# Patient Record
Sex: Female | Born: 2000
Health system: Southern US, Community
[De-identification: ages and names within clinical notes are randomized; demographics above are authoritative.]

## PROBLEM LIST (undated history)

## (undated) DIAGNOSIS — I2510 Atherosclerotic heart disease of native coronary artery without angina pectoris: Secondary | ICD-10-CM

## (undated) DIAGNOSIS — F4310 Post-traumatic stress disorder, unspecified: Secondary | ICD-10-CM

## (undated) DIAGNOSIS — Z950 Presence of cardiac pacemaker: Secondary | ICD-10-CM

## (undated) DIAGNOSIS — F32A Depression, unspecified: Secondary | ICD-10-CM

## (undated) DIAGNOSIS — F329 Major depressive disorder, single episode, unspecified: Secondary | ICD-10-CM

## (undated) DIAGNOSIS — Q203 Discordant ventriculoarterial connection: Secondary | ICD-10-CM

## (undated) DIAGNOSIS — Q249 Congenital malformation of heart, unspecified: Secondary | ICD-10-CM

## (undated) HISTORY — PX: OTHER SURGICAL HISTORY: SHX169

## (undated) HISTORY — PX: PACEMAKER LEAD REMOVAL: SHX5064

## (undated) HISTORY — PX: CARDIAC SURGERY: SHX584

---
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
## 2020-04-03 ENCOUNTER — Encounter (HOSPITAL_BASED_OUTPATIENT_CLINIC_OR_DEPARTMENT_OTHER): Payer: Self-pay | Admitting: Emergency Medicine

## 2020-04-03 ENCOUNTER — Other Ambulatory Visit (HOSPITAL_BASED_OUTPATIENT_CLINIC_OR_DEPARTMENT_OTHER): Payer: Self-pay | Admitting: Emergency Medicine

## 2020-04-03 ENCOUNTER — Emergency Department (HOSPITAL_BASED_OUTPATIENT_CLINIC_OR_DEPARTMENT_OTHER): Payer: Medicaid Other

## 2020-04-03 ENCOUNTER — Emergency Department (HOSPITAL_BASED_OUTPATIENT_CLINIC_OR_DEPARTMENT_OTHER)
Admission: EM | Admit: 2020-04-03 | Discharge: 2020-04-03 | Disposition: A | Discharge status: Home / Self Care | Payer: Medicaid Other | Attending: Emergency Medicine | Admitting: Emergency Medicine

## 2020-04-03 ENCOUNTER — Other Ambulatory Visit: Payer: Self-pay

## 2020-04-03 DIAGNOSIS — I2510 Atherosclerotic heart disease of native coronary artery without angina pectoris: Secondary | ICD-10-CM | POA: Insufficient documentation

## 2020-04-03 DIAGNOSIS — Z20822 Contact with and (suspected) exposure to covid-19: Secondary | ICD-10-CM | POA: Diagnosis not present

## 2020-04-03 DIAGNOSIS — R519 Headache, unspecified: Secondary | ICD-10-CM | POA: Diagnosis not present

## 2020-04-03 DIAGNOSIS — R5383 Other fatigue: Secondary | ICD-10-CM | POA: Insufficient documentation

## 2020-04-03 DIAGNOSIS — R112 Nausea with vomiting, unspecified: Secondary | ICD-10-CM

## 2020-04-03 DIAGNOSIS — Z950 Presence of cardiac pacemaker: Secondary | ICD-10-CM | POA: Insufficient documentation

## 2020-04-03 DIAGNOSIS — R42 Dizziness and giddiness: Secondary | ICD-10-CM | POA: Insufficient documentation

## 2020-04-03 HISTORY — DX: Atherosclerotic heart disease of native coronary artery without angina pectoris: I25.10

## 2020-04-03 LAB — CBC WITH DIFFERENTIAL/PLATELET
Abs Immature Granulocytes: 0.01 10*3/uL (ref 0.00–0.07)
Basophils Absolute: 0 10*3/uL (ref 0.0–0.1)
Basophils Relative: 0 %
Eosinophils Absolute: 0 10*3/uL (ref 0.0–0.5)
Eosinophils Relative: 0 %
HCT: 41.5 % (ref 36.0–46.0)
Hemoglobin: 13.8 g/dL (ref 12.0–15.0)
Immature Granulocytes: 0 %
Lymphocytes Relative: 8 %
Lymphs Abs: 0.7 10*3/uL (ref 0.7–4.0)
MCH: 29.5 pg (ref 26.0–34.0)
MCHC: 33.3 g/dL (ref 30.0–36.0)
MCV: 88.7 fL (ref 80.0–100.0)
Monocytes Absolute: 0.4 10*3/uL (ref 0.1–1.0)
Monocytes Relative: 5 %
Neutro Abs: 6.9 10*3/uL (ref 1.7–7.7)
Neutrophils Relative %: 87 %
Platelets: 186 10*3/uL (ref 150–400)
RBC: 4.68 MIL/uL (ref 3.87–5.11)
RDW: 13.4 % (ref 11.5–15.5)
WBC: 7.9 10*3/uL (ref 4.0–10.5)
nRBC: 0 % (ref 0.0–0.2)

## 2020-04-03 LAB — COMPREHENSIVE METABOLIC PANEL
ALT: 16 U/L (ref 0–44)
AST: 20 U/L (ref 15–41)
Albumin: 3.7 g/dL (ref 3.5–5.0)
Alkaline Phosphatase: 81 U/L (ref 38–126)
Anion gap: 13 (ref 5–15)
BUN: 12 mg/dL (ref 6–20)
CO2: 21 mmol/L — ABNORMAL LOW (ref 22–32)
Calcium: 8.9 mg/dL (ref 8.9–10.3)
Chloride: 105 mmol/L (ref 98–111)
Creatinine, Ser: 0.51 mg/dL (ref 0.44–1.00)
GFR, Estimated: >60 mL/min (ref >60)
Glucose, Bld: 147 mg/dL — ABNORMAL HIGH (ref 70–99)
Potassium: 3.6 mmol/L (ref 3.5–5.1)
Sodium: 139 mmol/L (ref 135–145)
Total Bilirubin: 0.4 mg/dL (ref 0.3–1.2)
Total Protein: 7.2 g/dL (ref 6.5–8.1)

## 2020-04-03 LAB — URINALYSIS, ROUTINE W REFLEX MICROSCOPIC
Bilirubin Urine: NEGATIVE
Glucose, UA: NEGATIVE mg/dL
Hgb urine dipstick: NEGATIVE
Ketones, ur: NEGATIVE mg/dL
Leukocytes,Ua: NEGATIVE
Nitrite: NEGATIVE
Protein, ur: NEGATIVE mg/dL
Specific Gravity, Urine: 1.03 (ref 1.005–1.030)
pH: 6 (ref 5.0–8.0)

## 2020-04-03 LAB — SARS CORONAVIRUS 2 BY RT PCR (HOSPITAL ORDER, PERFORMED IN ~~LOC~~ HOSPITAL LAB): SARS Coronavirus 2: NEGATIVE

## 2020-04-03 LAB — PREGNANCY, URINE: Preg Test, Ur: NEGATIVE

## 2020-04-03 LAB — LIPASE, BLOOD: Lipase: 28 U/L (ref 11–51)

## 2020-04-03 MED ORDER — ONDANSETRON HCL 4 MG/2ML IJ SOLN
4.0000 mg | Freq: Once | INTRAMUSCULAR | Status: AC
  Administered 2020-04-03: 4 mg via INTRAVENOUS
  Filled 2020-04-03: qty 2

## 2020-04-03 MED ORDER — PROMETHAZINE HCL 25 MG/ML IJ SOLN
12.5000 mg | Freq: Once | INTRAMUSCULAR | Status: AC
  Administered 2020-04-03: 12.5 mg via INTRAVENOUS
  Filled 2020-04-03: qty 1

## 2020-04-03 MED ORDER — PROMETHAZINE HCL 25 MG PO TABS: 25.0000 mg | ORAL_TABLET | Freq: Four times a day (QID) | ORAL | 0 refills | Status: AC | PRN

## 2020-04-03 MED ORDER — ACETAMINOPHEN 325 MG PO TABS
ORAL_TABLET | ORAL | Status: AC
  Administered 2020-04-03: 650 mg via ORAL
  Filled 2020-04-03: qty 2

## 2020-04-03 MED ORDER — ACETAMINOPHEN 325 MG PO TABS: 650.0000 mg | ORAL_TABLET | Freq: Once | ORAL | Status: AC

## 2020-04-03 MED FILL — PROMETHAZINE 25 MG TABLET: 25 | 7 days supply | Qty: 30 | Fill #0

## 2020-04-03 NOTE — Discharge Instructions (Signed)
You have been seen in the Emergency Department (ED) today for nausea and vomiting.  Your work up today has not shown a clear cause for your symptoms. You have been prescribed Phenergan; please use as prescribed as needed for your nausea.  Follow up with your doctor as soon as possible regarding today's emergent visit and your symptoms of nausea.   Return to the ED if you develop abdominal, bloody vomiting, bloody diarrhea, if you are unable to tolerate fluids due to vomiting, or if you develop other symptoms that concern you.  

## 2020-04-03 NOTE — ED Triage Notes (Signed)
Pt arrives via [facility] from home with c/o NV X1 day EMS reports temp of 100.5, 88% RA, pt reports thi si her baseline r/t heart issues, 70 HR, 1140/68, 16 RR. Pt does have a pacemaker.

## 2020-04-03 NOTE — ED Provider Notes (Signed)
Emergency Department Provider Note   I have reviewed the triage vital signs and the nursing notes.   HISTORY  Chief Complaint Emesis   HPI Carolyn West is a 20 y.o. female with past medical history of congenital heart defect causing baseline low O2 sat presents emergency department with nausea along with vomiting and headache.  Patient describes symptoms starting yesterday.  She is not feeling cough or shortness of breath.  She is having some headache along with nausea vomiting but no abdominal pain.  No associated diarrhea.  No blood in the emesis.  Patient arrives by EMS who reported temp on scene of 100.5 with room air sat of 88%.  Patient does describe some lightheaded feeling last night.  No sick contacts.   Past Medical History:  Diagnosis Date  . Congenital heart defect    Per [facility] cardiology records: transposition of the great vessels, VSD, subpulmonic stenosis, s/p modified Blalock-Taussig shunt, bidirectional Glenn, lateral tunnel fenestrated Fontan  . Coronary artery disease   . Depression   . Pacemaker    epicardial dual chamber PPM 2005, abdominal LUQ generator change 12/11/10, replacement of fractured atrial lead 04/24/15 via left thoracostomy approach   . PTSD (post-traumatic stress disorder)   . PTSD (post-traumatic stress disorder)   . Transposition of great arteries     Patient Active Problem List   Diagnosis Date Noted  . 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 CARDIAC SURGERY    . FLEXOR TENDON REPAIR Left 06/13/2019   Procedure: Left index finger flexor tendon repair, digital nerve repair and surgery as inidcated;  Surgeon: Ernest Mallick, MD;  Location: [facility] OR;  Service: Orthopedics;  Laterality: Left;   . PACEMAKER LEAD REMOVAL      replaced leads  .  pacermaker    . wisdom teeth extracted       Allergies Patient has no known allergies.  History reviewed. No pertinent family history.  Social History Social History   Tobacco Use  . Smoking status: Never Smoker  . Smokeless tobacco: Never Used  Vaping Use  . Vaping Use: Never used  Substance Use Topics  . Alcohol use: Not Currently  . Drug use: Not Currently    Review of Systems  Constitutional: Positive fever/chills Eyes: No visual changes. ENT: No sore throat. Cardiovascular: Denies chest pain. Respiratory: Denies shortness of breath. Gastrointestinal: No abdominal pain. Positive nausea and vomiting.  No diarrhea.  No constipation. Genitourinary: Negative for dysuria. Musculoskeletal: Negative for back pain. Skin: Negative for rash. Neurological: Negative for headaches, focal weakness or numbness.  10-point ROS otherwise negative.  ____________________________________________   PHYSICAL EXAM:  VITAL SIGNS: ED Triage Vitals  Enc Vitals Group     BP 04/03/20 0812 (!) 110/59     Pulse Rate 04/03/20 0812 87     Resp 04/03/20 0812 18     Temp 04/03/20 0812 98.5 F (36.9 C)     Temp Source 04/03/20 0812 Oral     SpO2 04/03/20 0812 (!) 82 %     Weight 04/03/20 0814 170 lb (77.1 kg)     Height 04/03/20 0814 5\' 6"  (1.676 m)   Constitutional: Alert and oriented. Well appearing and in no acute distress. Eyes: Conjunctivae are normal.  Head: Atraumatic. Nose: No congestion/rhinnorhea. Mouth/Throat: Mucous membranes are moist.  Cardiovascular: Normal  rate, regular rhythm. Good peripheral circulation. Grossly normal heart sounds.   Respiratory: Normal respiratory effort.  No retractions. Lungs CTAB. Gastrointestinal: Soft and nontender. No distention.  Musculoskeletal: No gross deformities of extremities. Neurologic:  Normal speech and language. Skin:  Skin is warm, dry and intact. No rash noted.   ____________________________________________   LABS (all  labs ordered are listed, but only abnormal results are displayed)  Labs Reviewed  COMPREHENSIVE METABOLIC PANEL - Abnormal; Notable for the following components:      Result Value   CO2 21 (*)    Glucose, Bld 147 (*)    All other components within normal limits  URINALYSIS, ROUTINE W REFLEX MICROSCOPIC - Abnormal; Notable for the following components:   Color, Urine ORANGE (*)    APPearance HAZY (*)    All other components within normal limits  SARS CORONAVIRUS 2 BY RT PCR (HOSPITAL ORDER, PERFORMED IN [facility])  LIPASE, BLOOD  CBC WITH DIFFERENTIAL/PLATELET  PREGNANCY, URINE   ____________________________________________  EKG   EKG Interpretation  Date/Time:  Tuesday April 03 2020 08:47:56 EST Ventricular Rate:  65 PR Interval:    QRS Duration: 92 QT Interval:  442 QTC Calculation: 460 R Axis:   73 Text Interpretation: Sinus rhythm Consider right ventricular hypertrophy Abnrm T, consider ischemia, anterolateral lds Similar to prior. Confirmed by Alona Bene [phone]) on 04/03/2020 8:52:21 AM       ____________________________________________  RADIOLOGY  DG Chest Portable 1 View  Result Date: 04/03/2020 CLINICAL DATA:  Cough.  Headache and dizziness EXAM: PORTABLE CHEST 1 VIEW COMPARISON:  Is 03/09/2015 FINDINGS: Pain direct epicardial the fibrillator lead. Stable heart size and mediastinal contours with widening at the aortic knob. Prior median sternotomy with pediatric size wires. There is no edema, consolidation, effusion, or pneumothorax. Artifact from EKG leads. IMPRESSION: No active disease. Electronically Signed   By: Marnee Spring M.D.   On: 04/03/2020 09:26    ____________________________________________   PROCEDURES  Procedure(s) performed:   Procedures  None ____________________________________________   INITIAL IMPRESSION / ASSESSMENT AND PLAN / ED COURSE  Pertinent labs & imaging results that were available during my care of the  patient were reviewed by me and considered in my medical decision making (see chart for details).   Patient presents to the emergency department with nausea, vomiting, headache, fever.  Abdomen is diffusely soft and nontender.  Doubt cholangitis, acute cholecystitis, appendicitis.  Patient's baseline oxygen saturations are low given her transposition of the great vessels.  She is in no acute distress.  No respiratory symptoms.  No meningeal findings on exam.  Plan for labs, chest x-ray, Covid testing.   COVID negative. Preg negative. No DKA. No severe electrolyte abnormality. Patient feeling improved here and tolerating PO. Abdominal exam is benign. Baseline vitals including hypoemia which is chronic and related to congenital heart diagnosis.  ____________________________________________  FINAL CLINICAL IMPRESSION(S) / ED DIAGNOSES  Final diagnoses:  Non-intractable vomiting with nausea, unspecified vomiting type  Fatigue, unspecified type     MEDICATIONS GIVEN DURING THIS VISIT:  Medications  ondansetron (ZOFRAN) injection 4 mg (4 mg Intravenous Given 04/03/20 0920)  acetaminophen (TYLENOL) tablet 650 mg (650 mg Oral Given 04/03/20 1051)  promethazine (PHENERGAN) injection 12.5 mg (12.5 mg Intravenous Given 04/03/20 1218)     NEW OUTPATIENT MEDICATIONS STARTED DURING THIS VISIT:  Discharge Medication List as of 04/03/2020  1:00 PM    START taking these medications   Details  promethazine (PHENERGAN) 25 MG tablet Take 1 tablet (  25 mg total) by mouth every 6 (six) hours as needed for nausea or vomiting., Starting Tue 04/03/2020, Normal        Note:  This document was prepared using Dragon voice recognition software and may include unintentional dictation errors.  Alona Bene, MD, [facility] Emergency Medicine    Pierina Schuknecht, Arlyss Repress, MD 04/03/20 [phone]

## 2020-04-03 NOTE — ED Notes (Signed)
Given Gatorade

## 2020-04-03 NOTE — ED Triage Notes (Signed)
Pt arrives via EMS with c/o HA, N/V and dizziness since last night. Pt endorses fever, denies cough, o2 sats normal at 82% per patient

## 2022-06-06 IMAGING — DX DG CHEST 1V PORT
1 series · 1 of 1 positions shown · non-contrast
Comparison: Is 03/09/2015

CLINICAL DATA: Cough.  Headache and dizziness

EXAM:
PORTABLE CHEST 1 VIEW

[chest ap]
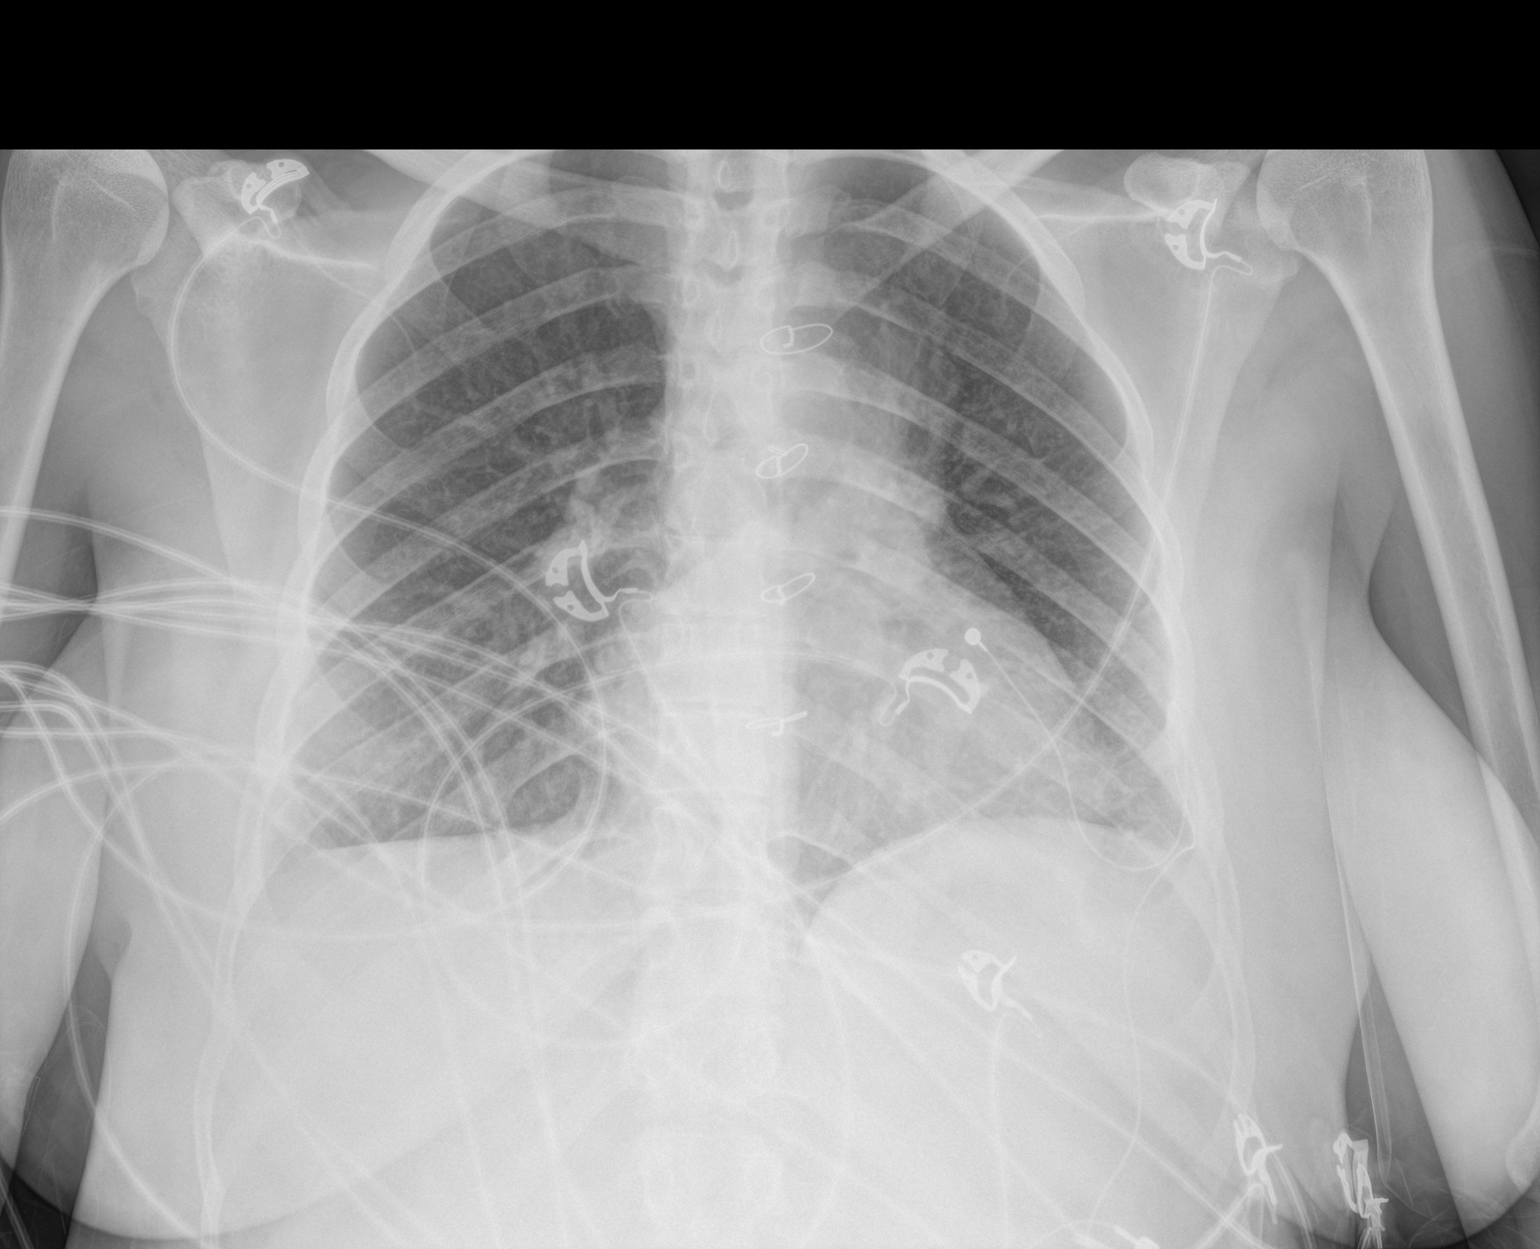

[1 of 1 positions shown; findings below may reference images not displayed]

FINDINGS: Pain direct epicardial the fibrillator lead. Stable heart size and
mediastinal contours with widening at the aortic knob. Prior median
sternotomy with pediatric size wires. There is no edema,
consolidation, effusion, or pneumothorax.

Artifact from EKG leads.
IMPRESSION: No active disease.

## 2022-11-09 ENCOUNTER — Other Ambulatory Visit: Payer: Self-pay

## 2022-11-09 ENCOUNTER — Emergency Department (HOSPITAL_COMMUNITY): Payer: Medicaid Other

## 2022-11-09 ENCOUNTER — Emergency Department (HOSPITAL_COMMUNITY)
Admission: EM | Admit: 2022-11-09 | Discharge: 2022-11-10 | Disposition: A | Discharge status: Other Acute Inpatient Hospital | Payer: Medicaid Other | Attending: Emergency Medicine | Admitting: Emergency Medicine

## 2022-11-09 ENCOUNTER — Encounter (HOSPITAL_COMMUNITY): Payer: Self-pay

## 2022-11-09 DIAGNOSIS — I2609 Other pulmonary embolism with acute cor pulmonale: Secondary | ICD-10-CM

## 2022-11-09 DIAGNOSIS — R0602 Shortness of breath: Secondary | ICD-10-CM | POA: Insufficient documentation

## 2022-11-09 DIAGNOSIS — Z1152 Encounter for screening for COVID-19: Secondary | ICD-10-CM | POA: Insufficient documentation

## 2022-11-09 DIAGNOSIS — Z950 Presence of cardiac pacemaker: Secondary | ICD-10-CM | POA: Insufficient documentation

## 2022-11-09 LAB — CBC
HCT: 48.5 % — ABNORMAL HIGH (ref 36.0–46.0)
Hemoglobin: 16 g/dL — ABNORMAL HIGH (ref 12.0–15.0)
MCH: 28 pg (ref 26.0–34.0)
MCHC: 33 g/dL (ref 30.0–36.0)
MCV: 84.9 fL (ref 80.0–100.0)
Platelets: 240 10*3/uL (ref 150–400)
RBC: 5.71 MIL/uL — ABNORMAL HIGH (ref 3.87–5.11)
RDW: 14.3 % (ref 11.5–15.5)
WBC: 5.3 10*3/uL (ref 4.0–10.5)
nRBC: 0 % (ref 0.0–0.2)

## 2022-11-09 LAB — BRAIN NATRIURETIC PEPTIDE: B Natriuretic Peptide: 24.3 pg/mL (ref 0.0–100.0)

## 2022-11-09 LAB — BASIC METABOLIC PANEL
Anion gap: 20 — ABNORMAL HIGH (ref 5–15)
BUN: 13 mg/dL (ref 6–20)
CO2: 19 mmol/L — ABNORMAL LOW (ref 22–32)
Calcium: 10.1 mg/dL (ref 8.9–10.3)
Chloride: 99 mmol/L (ref 98–111)
Creatinine, Ser: 0.9 mg/dL (ref 0.44–1.00)
GFR, Estimated: >60 mL/min (ref >60)
Glucose, Bld: 89 mg/dL (ref 70–99)
Potassium: 4.1 mmol/L (ref 3.5–5.1)
Sodium: 138 mmol/L (ref 135–145)

## 2022-11-09 LAB — HCG, SERUM, QUALITATIVE: Preg, Serum: NEGATIVE

## 2022-11-09 LAB — I-STAT CG4 LACTIC ACID, ED: Lactic Acid, Venous: 1.5 mmol/L (ref 0.5–1.9)

## 2022-11-09 LAB — SARS CORONAVIRUS 2 BY RT PCR: SARS Coronavirus 2 by RT PCR: NEGATIVE

## 2022-11-09 LAB — TROPONIN I (HIGH SENSITIVITY)
Troponin I (High Sensitivity): 5 ng/L (ref <18)
Troponin I (High Sensitivity): 4 ng/L (ref <18)

## 2022-11-09 MED ORDER — IOHEXOL 350 MG/ML SOLN
75.0000 mL | Freq: Once | INTRAVENOUS | Status: AC | PRN
  Administered 2022-11-09: 75 mL via INTRAVENOUS

## 2022-11-09 NOTE — ED Notes (Signed)
PT was placed on 2l nasal canula because her O2 was 83% and once placed on oxygen it came back up to 88%

## 2022-11-09 NOTE — ED Notes (Signed)
Patient transported to X-ray 

## 2022-11-09 NOTE — ED Triage Notes (Signed)
PT arrived from [facility] via [place] with a c/o of sudden onset of left sided chest pain.PT usually stats 80-85% normally due to heart issues but was stating at 64%. [facility] 2[facility] nasal [facility] and she came back up to 84%.PT has also had some issues with not eating or drinking since Jan but states she has always been like this.

## 2022-11-09 NOTE — ED Provider Notes (Signed)
[place] EMERGENCY DEPARTMENT AT [facility] Provider Note   CSN: [id number] Arrival date & time: 11/09/22  1805     History  Chief Complaint  Patient presents with  . Chest Pain    Carolyn West is a 22 y.o. 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  Patient stated her O2 sat usually range from 80 to 85% however today it dropped to the 69%.  Patient has had intermittent chest pain in the past 2 years, 2 or 3 times a month.  Today the pain is in the center chest radiates to the left shoulder.  She also reports intermittent swelling in bilateral ankles.  She denies any fever, cough, runny nose, abdominal pain, bowel change, urinary symptoms.   Chest Pain   Past Medical History:  Diagnosis Date  . Congenital heart defect    Per [facility] cardiology records: transposition of the great vessels, VSD, subpulmonic stenosis, s/p modified Blalock-Taussig shunt, bidirectional Glenn, lateral tunnel fenestrated Fontan  . Coronary artery disease   . Depression   . Pacemaker    epicardial dual chamber PPM 2005, abdominal LUQ generator change 12/11/10, replacement of fractured atrial lead 04/24/15 via left thoracostomy approach   . PTSD (post-traumatic stress disorder)   . PTSD (post-traumatic stress disorder)   . Transposition of great arteries    Past Surgical History:  Procedure Laterality Date  . CARDIAC SURGERY    . FLEXOR TENDON REPAIR Left 06/13/2019   Procedure: Left index finger flexor tendon repair, digital nerve repair and surgery as inidcated;  Surgeon: Ernest Mallick, MD;  Location: [facility] OR;  Service: Orthopedics;  Laterality: Left;   . PACEMAKER LEAD REMOVAL      replaced leads  . pacermaker    . wisdom teeth extracted        Home  Medications Prior to Admission medications   Medication Sig Start Date End Date Taking? Authorizing Provider  acetaminophen (TYLENOL) 500 MG tablet Take 500 mg by mouth every 6 (six) hours as needed for moderate pain.    [provider]  aspirin EC 81 MG tablet Take 81 mg by mouth daily.    [provider]  HYDROcodone-acetaminophen (NORCO) 5-325 MG tablet Take 1-2 tablets by mouth every 6 (six) hours as needed for moderate pain. 06/13/19   Cain Saupe III, MD  ibuprofen (ADVIL) 800 MG tablet Take 800 mg by mouth 4 (four) times daily as needed. 05/31/19   [provider]  penicillin v potassium (VEETID) 500 MG tablet Take 500 mg by mouth every 6 (six) hours. 05/31/19   [provider]  promethazine (PHENERGAN) 25 MG tablet Take 1 tablet (25 mg total) by mouth every 6 (six) hours as needed for nausea or vomiting. 04/03/20   Long, Arlyss Repress, MD  promethazine (PHENERGAN) 25 MG tablet TAKE 1 TABLET BY MOUTH EVERY 6 HOURS AS NEEDED FOR NAUSEA AND/OR VOMITING 04/03/20 04/03/21  Long, Arlyss Repress, MD      Allergies    Patient has no known allergies.    Review of Systems   Review of Systems  Cardiovascular:  Positive for chest pain.    Physical Exam Updated Vital Signs BP 117/80   Pulse 72   Temp 98.2 F (36.8 C) (Oral)   Resp 18  Ht 5\' 6"  (1.676 m)   Wt 77.1 kg   SpO2 (!) 83%   BMI 27.43 kg/m  Physical Exam Vitals and nursing note reviewed.  Constitutional:      Appearance: Normal appearance.  HENT:     Head: Normocephalic and atraumatic.     Mouth/Throat:     Mouth: Mucous membranes are moist.  Eyes:     General: No scleral icterus. Cardiovascular:     Rate and Rhythm: Normal rate and regular rhythm.     Pulses: Normal pulses.     Heart sounds: Normal heart sounds.  Pulmonary:     Effort: Pulmonary effort is normal.     Breath sounds: Normal breath sounds.     Comments: Patient is placed on 3 L of oxygen through nasal cannula, satting at  86%. Abdominal:     General: Abdomen is flat.     Palpations: Abdomen is soft.     Tenderness: There is no abdominal tenderness.  Musculoskeletal:        General: No deformity.  Skin:    General: Skin is warm.     Findings: No rash.  Neurological:     General: No focal deficit present.     Mental Status: She is alert.  Psychiatric:        Mood and Affect: Mood normal.     ED Results / Procedures / Treatments   Labs (all labs ordered are listed, but only abnormal results are displayed) Labs Reviewed  SARS CORONAVIRUS 2 BY RT PCR  BASIC METABOLIC PANEL  CBC  HCG, SERUM, QUALITATIVE  BRAIN NATRIURETIC PEPTIDE  I-STAT VENOUS BLOOD GAS, ED  TROPONIN I (HIGH SENSITIVITY)  TROPONIN I (HIGH SENSITIVITY)    EKG EKG Interpretation Date/Time:  Sunday November 09 2022 18:10:57 EDT Ventricular Rate:  85 PR Interval:  141 QRS Duration:  87 QT Interval:  341 QTC Calculation: 406 R Axis:   -29  Text Interpretation: Sinus rhythm Borderline left axis deviation Probable RVH w/ secondary repol abnormality Nonspecific T abnormalities, lateral leads Confirmed by Ernie Avena ([id number]) on 11/09/2022 7:40:04 PM  Radiology DG Chest 2 View  Result Date: 11/09/2022 CLINICAL DATA:  Chest pain EXAM: CHEST - 2 VIEW COMPARISON:  04/03/2020 FINDINGS: Normal heart size. Prior sternotomy. Epicardial pacer lead remains in place. No focal airspace consolidation, pleural effusion, or pneumothorax. IMPRESSION: No active cardiopulmonary disease. Electronically Signed   By: Duanne Guess D.O.   On: 11/09/2022 19:50    Procedures Procedures    Medications Ordered in ED Medications - No data to display  ED Course/ Medical Decision Making/ A&P Clinical Course as of 11/09/22 2118  [name] Nov 09, 2022  2114 CT Angio Chest PE W and/or Wo Contrast [JL]    Clinical Course User Index [JL] Ernie Avena, MD                                 Medical Decision Making Amount and/or Complexity of Data  Reviewed Labs: ordered. Radiology: ordered.   This patient presents to the ED for chest pain, shortness of breath, this involves an extensive number of treatment options, and is a complaint that carries with a high risk of complications and morbidity.  The differential diagnosis includes CHF/ACS, COPD asthma, pneumonia, anaphylaxis, PE, pneumothorax, anxiety, COVID-19, ACS/MI.  This is not an exhaustive list.  Lab tests: CBC, BMP, BNP, troponin, venous blood gas, troponins ordered and pending.  Imaging  studies: I ordered imaging studies, personally reviewed, interpreted imaging and agree with the radiologist's interpretations. The results include: Negative chest x-ray.  CT angio chest ordered and pending.  Problem list/ ED course/ Critical interventions/ Medical management: HPI: See above Vital signs within normal range and stable throughout visit. Laboratory/imaging studies significant for: See above. On physical examination, patient is afebrile and appears in no acute distress.  Patient presents with a chief complaint of chest pain and shortness of breath.  Her O2 sat at baseline is from 80 to 85% on room air.  Today it dropped to 69%.  She was placed on 3 L of nasal cannula and her O2Sat at the moment is 86%.  Breath sounds clear to auscultation bilaterally.  No significant lower extremity edema.  PE study ordered and pending.   9:12 PM I wean patient off of the oxygen and her O2 dropped to 79%.  She was immediately placed back on 2 L.  She is awake, alert and resting comfortably in bed.  No signs of any acute distress. I have reviewed the patient home medicines and have made adjustments as needed.  Cardiac monitoring/EKG: The patient was maintained on a cardiac monitor.  I personally reviewed and interpreted the cardiac monitor which showed an underlying rhythm of: sinus rhythm.  Additional history obtained: External records from outside source obtained and reviewed including: Chart  review including previous notes, labs, imaging.  Consultations obtained:  Disposition Patient's care signed out at shift change to [facility] PA-C with pending labs and imaging.  Possible admission due to increased oxygen requirement. This chart was dictated using voice recognition software.  Despite best efforts to proofread,  errors can occur which can change the documentation meaning.           Final Clinical Impression(s) / ED Diagnoses Final diagnoses:  Shortness of breath    Rx / DC Orders ED Discharge Orders     None         Jeanelle Malling, [place] 11/09/22 2118    Ernie Avena, MD 11/09/22 2145

## 2022-11-09 NOTE — ED Provider Notes (Signed)
Physical Exam  BP 117/80   Pulse 72   Temp 98.2 F (36.8 C) (Oral)   Resp 18   Ht 5\' 6"  (1.676 m)   Wt 77.1 kg   SpO2 (!) 83%   BMI 27.43 kg/m   Physical Exam Vitals reviewed.  Constitutional:      General: She is not in acute distress.    Appearance: She is not ill-appearing or toxic-appearing.  Cardiovascular:     Rate and Rhythm: Normal rate.  Pulmonary:     Effort: Pulmonary effort is normal. No tachypnea or respiratory distress.  Musculoskeletal:     Right lower leg: No tenderness. No edema.     Left lower leg: No tenderness. No edema.  Skin:    General: Skin is warm and dry.  Neurological:     Mental Status: She is alert.     Procedures  .Critical Care  Performed by: Achille Rich, PA-C Authorized by: Achille Rich, PA-C   Critical care provider statement:    Critical care time (minutes):  75   Critical care was necessary to treat or prevent imminent or life-threatening deterioration of the following conditions: Large PE.   Critical care was time spent personally by me on the following activities:  Development of treatment plan with patient or surrogate, discussions with consultants, evaluation of patient's response to treatment, examination of patient, ordering and review of laboratory studies, ordering and review of radiographic studies, ordering and performing treatments and interventions, pulse oximetry, re-evaluation of patient's condition, review of old charts and obtaining history from patient or surrogate   Care discussed with: admitting provider and accepting provider at another facility   Comments:     Patient with congenital cardiac abnormality with multiple cardiac surgeries in the past presented for worsening hypoxia with O2 sats in the 60s.  She improved to the 2 L of nasal cannula.  Her CT found to have large extensive PE involving the IVC and near occlusive of the pulmonary artery.  I spoke with multiple consultants including the transfer line,  pediatric cardiology, CT surgery, and adult cardiology.  The patient's been placed on heparin and is being transferred to a tertiary center.   ED Course / MDM   Clinical Course as of 11/10/22 0128  [name] Nov 09, 2022  2114 CT Angio Chest PE W and/or Wo Contrast [JL]    Clinical Course User Index [JL] Ernie Avena, MD   Medical Decision Making Amount and/or Complexity of Data Reviewed Labs: ordered. Radiology: ordered. Decision-making details documented in ED Course.  Risk Prescription drug management.   Accepted handoff at shift change from [name] PA-C. Please see prior provider note for more detail.   Briefly: Patient is 22 y.o. F with history of transition of the great arteries, VSD, subpulmonary stenosis status post multiple surgeries presents emerged from today for evaluation of hypoxia.  Patient is usually hypoxic into the 80s however she was found to be in the 60s.  She was [facility] on 2 L [facility] and was transferred over here for evaluation.  She denies any acute worsening of her chest pain and shortness of breath.  Reports that she is not feeling it more since January.  She reports she has had intermittent bilateral leg swelling but does not have any of them now.  She is not on any birth control or any exogenous hormones.  She denies any hypercoagulability issues, personal history of cancer, unilateral leg swelling.  We are currently awaiting  CT angio to rule out any pulmonary embolism or other pulmonology pathology causing her worsening hypoxia.  CT Angio shows 1. Large burden of right-sided pulmonary arterial emboli with no appreciable arterial opacification in the right lung. 2. The clot-distended right pulmonary artery appears to surgically communicate with the IVC with clot suspected extending down the IVC and distending it. 3. There is a small right atrium and main pulmonary artery, with scattered clot in the small pulmonary trunk. 4. Elevated RV/LV ratio of  1.3 consistent with right heart strain, consistent with at least submassive (intermediate risk) PE. The presence of right heart strain has been associated with an increased risk of morbidity and mortality. Please refer to the "Code PE Focused" order set in EPIC. 5. Multiple enhancing venous collaterals around the tracheal and paracarinal areas. 6. Dilated aortic root at the sinuses of Valsalva where it measures 4.2 cm. Aorta sits above a sizable VSD with free communication between the ventricles. 7. Serpiginous faintly opacified vessels to the left of the aorta, which appear to drain to the left superior pulmonary vein. 8. Mosaicism in the lungs which could be due to perfusional differences and/or small airways disease. 9. Epicardial pacing wire along the left mid heart border extending up from the left hemiabdomen where there is a generator device implanted.  Given the patient is medically complex and is still followed by pediatric cardiology given her multiple surgeries and congenital heart problems, I have consulted her pediatric cardiologist and spoke with Dr. Elvia Collum with [facility]. We then involved CT surgery and spoke with Dr. Alisia Ferrari who does not think this patient is a surgical candidate for thrombectomy given that she is otherwise well-appearing and on 2 L of nasal cannula.  Concern for bringing a 22 year old patient over to [facility] have been involved adult cardiology and spoke with Dr. Theresia Lo and Dr. Sandi Mariscal. Discussed Heparin dosing, recommended usual PE dose. Dr. Sandi Mariscal will be the accepting physician over at [facility].   Heparin per pharmacy dose was ordered.   I also received a phone call from [employer] during this time. The representative discussed with me "the patient has a [employer] dual-chamber.  Last interrogation was November 2020.  Since then she has had 8000 atrial high rate episodes with the last one being September 5th.  Atrial lead warning with high  impedance but read was within range today.  Device is functioning as programmed". I read the results back to her to confirm.   Discussed the need for transfer with the patient which she is amenable to.  She is still speaking in full sentences with ease and is satting her usual low 80s on 2 L of nasal cannula.  She does not appear to be any acute respiratory distress.  She does not have any lower leg edema.  Well-appearing.  Patient is being transferred over to [facility]. Dr. Sandi Mariscal as the accepting physician. Will be transferred with ACLS truck.   Results for orders placed or performed during the hospital encounter of 11/09/22  SARS Coronavirus 2 by RT PCR (hospital order, performed in [facility] hospital lab) *cepheid single result test* Anterior Nasal Swab   Specimen: Anterior Nasal Swab  Result Value Ref Range   SARS Coronavirus 2 by RT PCR NEGATIVE NEGATIVE  Basic metabolic panel  Result Value Ref Range   Sodium 138 135 - 145 mmol/L   Potassium 4.1 3.5 - 5.1 mmol/L   Chloride 99 98 - 111 mmol/L   CO2 19 (L) 22 -  32 mmol/L   Glucose, Bld 89 70 - 99 mg/dL   BUN 13 6 - 20 mg/dL   Creatinine, Ser 1.61 0.44 - 1.00 mg/dL   Calcium 09.6 8.9 - 04.5 mg/dL   GFR, Estimated >40 >98 mL/min   Anion gap 20 (H) 5 - 15  CBC  Result Value Ref Range   WBC 5.3 4.0 - 10.5 K/uL   RBC 5.71 (H) 3.87 - 5.11 MIL/uL   Hemoglobin 16.0 (H) 12.0 - 15.0 g/dL   HCT 11.9 (H) 14.7 - 82.9 %   MCV 84.9 80.0 - 100.0 fL   MCH 28.0 26.0 - 34.0 pg   MCHC 33.0 30.0 - 36.0 g/dL   RDW 56.2 13.0 - 86.5 %   Platelets 240 150 - 400 K/uL   nRBC 0.0 0.0 - 0.2 %  hCG, serum, qualitative  Result Value Ref Range   Preg, Serum NEGATIVE NEGATIVE  Brain natriuretic peptide  Result Value Ref Range   B Natriuretic Peptide 24.3 0.0 - 100.0 pg/mL  I-Stat CG4 Lactic Acid, ED  Result Value Ref Range   Lactic Acid, Venous 1.5 0.5 - 1.9 mmol/L  Troponin I (High Sensitivity)  Result Value Ref Range   Troponin I (High  Sensitivity) 5 <18 ng/L  Troponin I (High Sensitivity)  Result Value Ref Range   Troponin I (High Sensitivity) 4 <18 ng/L   CT Angio Chest PE W and/or Wo Contrast  Result Date: 11/09/2022 CLINICAL DATA:  Pulmonary embolism suspected, high probability with sudden onset left-sided chest pain and hypoxemia with oxygen saturation only 64%. History of congenital heart disease with corrective surgical repair. By what is available in the Epic chart she had congenital transposition of the great vessels, VSD and subpulmonic stenosis and underwent a modified Blalock-Taussig shunt with bidirectional Glenn, lateral tunnel fenestrated Fontan. EXAM: CT ANGIOGRAPHY CHEST WITH CONTRAST TECHNIQUE: Multidetector CT imaging of the chest was performed using the standard protocol during bolus administration of intravenous contrast. Multiplanar CT image reconstructions and MIPs were obtained to evaluate the vascular anatomy. RADIATION DOSE REDUCTION: This exam was performed according to the departmental dose-optimization program which includes automated exposure control, adjustment of the mA and/or kV according to patient size and/or use of iterative reconstruction technique. CONTRAST:  75mL OMNIPAQUE IOHEXOL 350 MG/ML SOLN COMPARISON:  No prior cross-sectional imaging for comparison. Most recent PA and lateral chest was today compared with portable 04/03/2020. FINDINGS: Cardiovascular: There is mild cardiomegaly, approximately equal size of the right atrium and ventricle and defect of the upper half of the ventricular septum with free communication between the two ventricles. There are sternotomy sutures as before. The aorta sits above the defect receiving blood from both chambers and there is prominence of the aortic root at the sinuses of Valsalva where it measures 4.2 cm. Rest of the aorta is normal in caliber. There is normal great vessel branching with a left-sided arch. There is a normal variant origin of the left vertebral  artery from the aortic arch. The coronary arteries opacify well. Central pulmonary veins are normal caliber and patent. There is a small sized right atrium. The main pulmonary artery is also quite small. There is scattered clot in the small pulmonary trunk. There is a large embolus distending the right main pulmonary artery with extension into the right upper, middle and lower lobe arteries and no appreciable arterial opacification in the right lung. The clot fills right pulmonary artery, which appears to surgically communicate with the IVC with clot suspected extending  down the IVC and distending it. There is elevated RV/LV ratio of 1.3 consistent with right heart strain. There are multiple enhancing venous collaterals around the tracheal and paracarinal areas. To the left of the ascending aorta there are serpiginous distended faintly contrast opacified structures which could be shunt vessels and are apparently draining into the left superior pulmonary vein. The left pulmonary artery and branches appear to opacify well within the left lung. There are no visible left-sided emboli. There is no pericardial effusion. No aortic dissection. There is an epicardial pacing wire along the left mid heart border extending up from the left hemiabdomen where there is a generator device implanted. Mediastinum/Nodes: No adenopathy or thyroid mass. Unremarkable esophagus. Normal trachea and main bronchi. Lungs/Pleura: Scattered linear scarring or atelectasis both lung bases. Mosaicism on the left-greater-than-right is seen and could be due to perfusional differences and/or small airways disease. No focal pneumonic infiltrate is seen. No pleural effusion. Upper Abdomen: No acute abnormality. Musculoskeletal: Status post median sternotomy. No acute or significant osseous findings or chest wall mass. Review of the MIP images confirms the above findings. IMPRESSION: 1. Large burden of right-sided pulmonary arterial emboli with no  appreciable arterial opacification in the right lung. 2. The clot-distended right pulmonary artery appears to surgically communicate with the IVC with clot suspected extending down the IVC and distending it. 3. There is a small right atrium and main pulmonary artery, with scattered clot in the small pulmonary trunk. 4. Elevated RV/LV ratio of 1.3 consistent with right heart strain, consistent with at least submassive (intermediate risk) PE. The presence of right heart strain has been associated with an increased risk of morbidity and mortality. Please refer to the "Code PE Focused" order set in EPIC. 5. Multiple enhancing venous collaterals around the tracheal and paracarinal areas. 6. Dilated aortic root at the sinuses of Valsalva where it measures 4.2 cm. Aorta sits above a sizable VSD with free communication between the ventricles. 7. Serpiginous faintly opacified vessels to the left of the aorta, which appear to drain to the left superior pulmonary vein. 8. Mosaicism in the lungs which could be due to perfusional differences and/or small airways disease. 9. Epicardial pacing wire along the left mid heart border extending up from the left hemiabdomen where there is a generator device implanted. 10. Critical Value/emergent results were called by telephone at the time of interpretation on 11/09/2022 at 11:04 pm to provider PA Usman Millett, who verbally acknowledged these results. Electronically Signed   By: Almira Bar M.D.   On: 11/09/2022 23:39   DG Chest 2 View  Result Date: 11/09/2022 CLINICAL DATA:  Chest pain EXAM: CHEST - 2 VIEW COMPARISON:  04/03/2020 FINDINGS: Normal heart size. Prior sternotomy. Epicardial pacer lead remains in place. No focal airspace consolidation, pleural effusion, or pneumothorax. IMPRESSION: No active cardiopulmonary disease. Electronically Signed   By: Duanne Guess D.O.   On: 11/09/2022 19:50        Achille Rich, PA-C 11/10/22 0132    Carolyn Rough, MD 11/11/22  [phone]

## 2022-11-09 NOTE — ED Notes (Signed)
Patient transported to CT 

## 2022-11-10 DIAGNOSIS — R0602 Shortness of breath: Secondary | ICD-10-CM | POA: Diagnosis present

## 2022-11-10 DIAGNOSIS — Z1152 Encounter for screening for COVID-19: Secondary | ICD-10-CM | POA: Diagnosis not present

## 2022-11-10 DIAGNOSIS — Z950 Presence of cardiac pacemaker: Secondary | ICD-10-CM | POA: Diagnosis not present

## 2022-11-10 MED ORDER — HEPARIN (PORCINE) 25000 UT/250ML-% IV SOLN
1100.0000 [IU]/h | INTRAVENOUS | Status: DC
  Administered 2022-11-10: 1100 [IU]/h via INTRAVENOUS
  Filled 2022-11-10: qty 250

## 2022-11-10 MED ORDER — HEPARIN BOLUS VIA INFUSION
5000.0000 [IU] | Freq: Once | INTRAVENOUS | Status: AC
  Administered 2022-11-10: 5000 [IU] via INTRAVENOUS
  Filled 2022-11-10: qty 5000

## 2022-11-10 NOTE — ED Provider Notes (Signed)
1:19 AM Patient reassessed. Being loaded on stretcher with transport team. No acute complaints. Maintaining sats in 80's on 2L via [place]; this is typically her baseline on room air. EMTALA completed.   Antony Madura, PA-C 11/10/22 0120    Zadie Rhine, MD 11/10/22 [phone]

## 2022-11-10 NOTE — ED Notes (Signed)
PT transported via [facility], Heparin infusing, Emtla sent and signed.

## 2022-11-10 NOTE — Progress Notes (Signed)
ANTICOAGULATION CONSULT NOTE - Initial Consult  Pharmacy Consult for Heparin  Indication: pulmonary embolus  No Known Allergies  Patient Measurements: Height: 5\' 6"  (167.6 cm) Weight: 77.1 kg (169 lb 15.6 oz) IBW/kg (Calculated) : 59.3  Vital Signs: Temp: 98.2 F (36.8 C) (09/09 0121) Temp Source: Oral (09/09 0121) BP: 122/86 (09/09 0121) Pulse Rate: 77 (09/09 0121)  Labs: Recent Labs    11/09/22 2022 11/09/22 2120  HGB 16.0*  --   HCT 48.5*  --   PLT 240  --   CREATININE 0.90  --   TROPONINIHS 5 4    Estimated Creatinine Clearance: 102.8 mL/min (by C-G formula based on SCr of 0.9 mg/dL).   Medical History: Past Medical History:  Diagnosis Date   Congenital heart defect    Per [facility] cardiology records: transposition of the great vessels, VSD, subpulmonic stenosis, s/p modified Blalock-Taussig shunt, bidirectional Glenn, lateral tunnel fenestrated Fontan   Coronary artery disease    Depression    Pacemaker    epicardial dual chamber PPM 2005, abdominal LUQ generator change 12/11/10, replacement of fractured atrial lead 04/24/15 via left thoracostomy approach    PTSD (post-traumatic stress disorder)    PTSD (post-traumatic stress disorder)    Transposition of great arteries     Assessment: 22 y/o F with new onset PE, clot with surgical communication with IVC, pt has congential heart defect s/p surgical correction, labs above reviewed.   Goal of Therapy:  Heparin level 0.3-0.7 units/ml Monitor platelets by anticoagulation protocol: Yes   Plan:  Heparin 5000 units bolus Start heparin drip at 1100 units/hr Pt transferred to [facility]  Abran Duke, PharmD, BCPS Clinical Pharmacist Phone: [phone]

## 2022-11-10 NOTE — ED Notes (Signed)
Transport has been called to go to [facility]

## 2022-11-10 NOTE — ED Notes (Signed)
Pacemaker has been interrogated and sent.
